# Patient Record
Sex: Male | Born: 1985 | Race: Black or African American | Hispanic: No | Marital: Single | Smoking: Current every day smoker
Health system: Southern US, Community
[De-identification: ages and names within clinical notes are randomized; demographics above are authoritative.]

## PROBLEM LIST (undated history)

## (undated) DIAGNOSIS — K209 Esophagitis, unspecified without bleeding: Secondary | ICD-10-CM

---

## 2017-02-14 ENCOUNTER — Emergency Department (HOSPITAL_BASED_OUTPATIENT_CLINIC_OR_DEPARTMENT_OTHER): Payer: Self-pay

## 2017-02-14 ENCOUNTER — Encounter (HOSPITAL_BASED_OUTPATIENT_CLINIC_OR_DEPARTMENT_OTHER): Payer: Self-pay | Admitting: Emergency Medicine

## 2017-02-14 ENCOUNTER — Emergency Department (HOSPITAL_BASED_OUTPATIENT_CLINIC_OR_DEPARTMENT_OTHER)
Admission: EM | Admit: 2017-02-14 | Discharge: 2017-02-14 | Disposition: A | Discharge status: Home / Self Care | Payer: Self-pay | Attending: Emergency Medicine | Admitting: Emergency Medicine

## 2017-02-14 DIAGNOSIS — N200 Calculus of kidney: Secondary | ICD-10-CM

## 2017-02-14 DIAGNOSIS — F17200 Nicotine dependence, unspecified, uncomplicated: Secondary | ICD-10-CM | POA: Insufficient documentation

## 2017-02-14 HISTORY — DX: Esophagitis, unspecified: K20.9

## 2017-02-14 HISTORY — DX: Esophagitis, unspecified without bleeding: K20.90

## 2017-02-14 LAB — BASIC METABOLIC PANEL
Anion gap: 9 (ref 5–15)
BUN: 13 mg/dL (ref 6–20)
CALCIUM: 9.4 mg/dL (ref 8.9–10.3)
CHLORIDE: 103 mmol/L (ref 101–111)
CO2: 25 mmol/L (ref 22–32)
CREATININE: 1.18 mg/dL (ref 0.61–1.24)
GFR calc non Af Amer: >60 mL/min (ref >60)
GLUCOSE: 126 mg/dL — AB (ref 65–99)
Potassium: 4 mmol/L (ref 3.5–5.1)
Sodium: 137 mmol/L (ref 135–145)

## 2017-02-14 LAB — CBC WITH DIFFERENTIAL/PLATELET
Basophils Absolute: 0 10*3/uL (ref 0.0–0.1)
Basophils Relative: 0 %
Eosinophils Absolute: 0 10*3/uL (ref 0.0–0.7)
Eosinophils Relative: 0 %
HEMATOCRIT: 39.7 % (ref 39.0–52.0)
HEMOGLOBIN: 13.9 g/dL (ref 13.0–17.0)
LYMPHS ABS: 0.7 10*3/uL (ref 0.7–4.0)
LYMPHS PCT: 8 %
MCH: 29.6 pg (ref 26.0–34.0)
MCHC: 35 g/dL (ref 30.0–36.0)
MCV: 84.6 fL (ref 78.0–100.0)
MONO ABS: 0.4 10*3/uL (ref 0.1–1.0)
MONOS PCT: 4 %
NEUTROS ABS: 7.7 10*3/uL (ref 1.7–7.7)
Neutrophils Relative %: 88 %
Platelets: 178 10*3/uL (ref 150–400)
RBC: 4.69 MIL/uL (ref 4.22–5.81)
RDW: 13.4 % (ref 11.5–15.5)
WBC: 8.8 10*3/uL (ref 4.0–10.5)

## 2017-02-14 MED ORDER — MORPHINE SULFATE (PF) 4 MG/ML IV SOLN
4.0000 mg | Freq: Once | INTRAVENOUS | Status: AC
  Administered 2017-02-14: 4 mg via INTRAVENOUS

## 2017-02-14 MED ORDER — OXYCODONE HCL 5 MG PO TABS: 5.0000 mg | ORAL_TABLET | Freq: Two times a day (BID) | ORAL | 0 refills | Status: DC | PRN

## 2017-02-14 MED ORDER — MORPHINE SULFATE (PF) 4 MG/ML IV SOLN
4.0000 mg | Freq: Once | INTRAVENOUS | Status: AC
  Administered 2017-02-14: 4 mg via INTRAVENOUS
  Filled 2017-02-14: qty 1

## 2017-02-14 MED ORDER — ONDANSETRON 4 MG PO TBDP: 4.0000 mg | ORAL_TABLET | Freq: Three times a day (TID) | ORAL | 0 refills | Status: DC | PRN

## 2017-02-14 MED ORDER — SODIUM CHLORIDE 0.9 % IV BOLUS (SEPSIS)
1000.0000 mL | Freq: Once | INTRAVENOUS | Status: AC
  Administered 2017-02-14: 1000 mL via INTRAVENOUS

## 2017-02-14 MED ORDER — ONDANSETRON HCL 4 MG/2ML IJ SOLN
4.0000 mg | Freq: Once | INTRAMUSCULAR | Status: AC
  Administered 2017-02-14: 4 mg via INTRAVENOUS
  Filled 2017-02-14: qty 2

## 2017-02-14 MED ORDER — MORPHINE SULFATE (PF) 4 MG/ML IV SOLN
INTRAVENOUS | Status: AC
  Administered 2017-02-14: 4 mg via INTRAVENOUS
  Filled 2017-02-14: qty 1

## 2017-02-14 MED ORDER — KETOROLAC TROMETHAMINE 30 MG/ML IJ SOLN
30.0000 mg | Freq: Once | INTRAMUSCULAR | Status: AC
  Administered 2017-02-14: 30 mg via INTRAVENOUS
  Filled 2017-02-14: qty 1

## 2017-02-14 NOTE — ED Triage Notes (Signed)
Pt reports mid abd pain with vomiting x 3 episodes starting at 2200 last night. Pt reports similar symptoms previously dx with esophagitis.

## 2017-02-14 NOTE — ED Provider Notes (Signed)
[facility]   CSN: [id number] Arrival date & time: 02/14/17  0453     History   Chief Complaint Chief Complaint  Patient presents with  . Abdominal Pain    HPI Dylan Bright is a 31 y.o. male.With no significant past medical history presenting today after having sudden onset right lower quadrant abdominal pain tonight. Patient states he went any dangerousness.. There is no radiation of the pain. He denies dysuria or hematuria. This occurred to him once in the pelvis" esophagitis. He has had nausea and vomiting. No diarrhea. No fevers. He since has not has his normal appetite. Then no further complaints.  10 Systems reviewed and are negative for acute change except as noted in the HPI.   HPI  Past Medical History:  Diagnosis Date  . Esophagitis     There are no active problems to display for this patient.   History reviewed. No pertinent surgical history.     Home Medications    Prior to Admission medications   Medication Sig Start Date End Date Taking? Authorizing Provider  ondansetron (ZOFRAN ODT) 4 MG disintegrating tablet Take 1 tablet (4 mg total) by mouth every 8 (eight) hours as needed for nausea or vomiting. 02/14/17   Tomasita Crumble, MD  oxyCODONE (ROXICODONE) 5 MG immediate release tablet Take 1 tablet (5 mg total) by mouth 2 (two) times daily as needed for severe pain. 02/14/17   Tomasita Crumble, MD    Family History No family history on file.  Social History Social History  Substance Use Topics  . Smoking status: Current Every Day Smoker  . Smokeless tobacco: Never Used  . Alcohol use Yes     Allergies   Patient has no known allergies.   Review of Systems Review of Systems   Physical Exam Updated Vital Signs BP (!) 155/92 (BP Location: Right Arm)   Pulse 67   Temp 98.2 F (36.8 C)   Resp 18   Ht 5\' 8"  (1.727 m)   Wt 155 lb (70.3 kg)   SpO2 100%   BMI 23.57 kg/m   Physical Exam  Constitutional: He is oriented  to person, place, and time. Vital signs are normal. He appears well-developed and well-nourished.  Non-toxic appearance. He does not appear ill. He appears distressed.  HENT:  Head: Normocephalic and atraumatic.  Nose: Nose normal.  Mouth/Throat: Oropharynx is clear and moist. No oropharyngeal exudate.  Eyes: Conjunctivae and EOM are normal. Pupils are equal, round, and reactive to light. No scleral icterus.  Neck: Normal range of motion. Neck supple. No tracheal deviation, no edema, no erythema and normal range of motion present. No thyroid mass and no thyromegaly present.  Cardiovascular: Normal rate, regular rhythm, S1 normal, S2 normal, normal heart sounds, intact distal pulses and normal pulses.  Exam reveals no gallop and no friction rub.   No murmur heard. Pulmonary/Chest: Effort normal and breath sounds normal. No respiratory distress. He has no wheezes. He has no rhonchi. He has no rales.  Abdominal: Soft. Normal appearance and bowel sounds are normal. He exhibits no distension, no ascites and no mass. There is no hepatosplenomegaly. There is tenderness. There is no rebound, no guarding and no CVA tenderness.  Mild right lower quadrant palpation.  Musculoskeletal: Normal range of motion. He exhibits no edema or tenderness.  Lymphadenopathy:    He has no cervical adenopathy.  Neurological: He is alert and oriented to person, place, and time. He has normal strength. No cranial nerve  deficit or sensory deficit.  Skin: Skin is warm, dry and intact. No petechiae and no rash noted. He is not diaphoretic. No erythema. No pallor.  Nursing note and vitals reviewed.    ED Treatments / Results  Labs (all labs ordered are listed, but only abnormal results are displayed) Labs Reviewed  BASIC METABOLIC PANEL - Abnormal; Notable for the following:       Result Value   Glucose, Bld 126 (*)    All other components within normal limits  CBC WITH DIFFERENTIAL/PLATELET    EKG  EKG  Interpretation None       Radiology Ct Renal Stone Study  Result Date: 02/14/2017 CLINICAL DATA:  Abdominal pain and nausea and vomiting. Right lower quadrant. EXAM: CT ABDOMEN AND PELVIS WITHOUT CONTRAST TECHNIQUE: Multidetector CT imaging of the abdomen and pelvis was performed following the standard protocol without IV contrast. COMPARISON:  None. FINDINGS: Lower chest: No pulmonary nodules or pleural effusion. No visible pericardial effusion. Hepatobiliary: Normal noncontrast appearance of the liver. No visible biliary dilatation. Normal gallbladder. Pancreas: Normal noncontrast appearance of the pancreas. No peripancreatic fluid collection. Spleen: Normal. Adrenal glands: Normal. Urinary Tract: --Right kidney/ureter: There are multiple right renal stones, measuring up to 3 mm. There is mild right hydroureteronephrosis. At the right ureterovesical junction, there is an obstructing stone that measures 4 x 3 mm. --Left kidney/ureter: There are multiple left renal calculi measuring up to 3 mm. No left hydronephrosis. The left ureter is unobstructed. --Urinary bladder: Unremarkable. Stomach/Bowel: No dilated loops of bowel. No evidence of colonic or enteric inflammation. No fluid collection within the abdomen. Normal appendix. Vascular/Lymphatic: No abdominal aortic aneurysm or atherosclerotic calcification. No abdominal or pelvic lymphadenopathy. Reproductive: Normal prostate. Musculoskeletal. No focal osseous lesion. Normal visualized extraperitoneal and extrathoracic soft tissues. IMPRESSION: 1. Right-sided obstructive uropathy with 4 x 3 mm stone at the right ureterovesical junction, resulting and mild right hydroureteronephrosis. 2. Other bilateral nonobstructing renal calculi. Electronically Signed   By: Deatra Robinson M.D.   On: 02/14/2017 06:09    Procedures Procedures (including critical care time)  Medications Ordered in ED Medications  sodium chloride 0.9 % bolus 1,000 mL (1,000 mLs  Intravenous New Bag/Given 02/14/17 0518)  morphine 4 MG/ML injection 4 mg (4 mg Intravenous Given 02/14/17 0515)  ketorolac (TORADOL) 30 MG/ML injection 30 mg (30 mg Intravenous Given 02/14/17 0515)  ondansetron (ZOFRAN) injection 4 mg (4 mg Intravenous Given 02/14/17 0515)     Initial Impression / Assessment and Plan / ED Course  I have reviewed the triage vital signs and the nursing notes.  Pertinent labs & imaging results that were available during my care of the patient were reviewed by me and considered in my medical decision making (see chart for details).      Patient presents to the emergency department for pain in the right lower quadrant. Given the sudden onset will obtain CT scan for further evaluation. He was given Toradol and morphine for pain control. Labs are pending.  Upon repeat evaluation, patient states his pain has improved but not resolved. He was given a second dose of morphine. Creatinine is normal, CT scan reveals right-sided nephrolithiasis with mild hydronephrosis. Education provided and he was given follow-up to see Dr. Marlou Porch in clinic. He appears well in no acute distress. Vitals taken ibuprofen for pain, or oxycodone for severe pain. He patient is safe for discharge. Final Clinical Impressions(s) / ED Diagnoses   Final diagnoses:  Nephrolithiasis    New Prescriptions New Prescriptions  ONDANSETRON (ZOFRAN ODT) 4 MG DISINTEGRATING TABLET    Take 1 tablet (4 mg total) by mouth every 8 (eight) hours as needed for nausea or vomiting.   OXYCODONE (ROXICODONE) 5 MG IMMEDIATE RELEASE TABLET    Take 1 tablet (5 mg total) by mouth 2 (two) times daily as needed for severe pain.     Tomasita CrumbleAdeleke Tim Wilhide, MD 02/14/17 [id number]

## 2017-02-14 NOTE — ED Notes (Signed)
ED Provider at bedside. 

## 2017-02-14 NOTE — ED Notes (Signed)
Patient transported to CT 

## 2017-02-14 NOTE — ED Notes (Signed)
Given urine strainer and specimen cup. Pt instructed to strain urine and if stone is caught take it to urologist with him. Verbalized understanding.

## 2017-08-19 ENCOUNTER — Emergency Department (HOSPITAL_BASED_OUTPATIENT_CLINIC_OR_DEPARTMENT_OTHER)
Admission: EM | Admit: 2017-08-19 | Discharge: 2017-08-19 | Disposition: A | Discharge status: Home / Self Care | Payer: Self-pay | Attending: Emergency Medicine | Admitting: Emergency Medicine

## 2017-08-19 ENCOUNTER — Encounter (HOSPITAL_BASED_OUTPATIENT_CLINIC_OR_DEPARTMENT_OTHER): Payer: Self-pay | Admitting: *Deleted

## 2017-08-19 DIAGNOSIS — Z79899 Other long term (current) drug therapy: Secondary | ICD-10-CM | POA: Insufficient documentation

## 2017-08-19 DIAGNOSIS — L03012 Cellulitis of left finger: Secondary | ICD-10-CM | POA: Insufficient documentation

## 2017-08-19 DIAGNOSIS — F17200 Nicotine dependence, unspecified, uncomplicated: Secondary | ICD-10-CM | POA: Insufficient documentation

## 2017-08-19 MED ORDER — DOXYCYCLINE HYCLATE 100 MG PO CAPS: 100.0000 mg | ORAL_CAPSULE | Freq: Two times a day (BID) | ORAL | 0 refills | Status: DC

## 2017-08-19 NOTE — ED Triage Notes (Signed)
Paronychia left middle finger. States it was drained at [facility] last week but infection came back.

## 2017-08-19 NOTE — ED Notes (Signed)
Pt c/o left middle finger pain and swelling around nail bed.  He did not get his abx filled from [facility] from the first visit.

## 2017-08-19 NOTE — ED Notes (Signed)
Pt verbalizes understanding of d/c instructions and denies any further needs at this time. 

## 2017-08-19 NOTE — ED Notes (Signed)
ED Provider at bedside. 

## 2017-08-19 NOTE — Discharge Instructions (Signed)
Soak the finger daily in warm water or warm water and Epsom salts for 20 minutes it would twice a day Levitra do it twice a day. Take antibiotic as directed. Return if the finger gets worse swelling. Return for any new or worse symptoms.

## 2017-08-19 NOTE — ED Provider Notes (Signed)
[facility]   CSN: [id number] Arrival date & time: 08/19/17  2019     History   Chief Complaint Chief Complaint  Patient presents with  . Recurrent Skin Infections    HPI Dylan Bright is a 31 y.o. male.  Patient with a infection to the distal aspect of the left middle finger that seems to be consistent with a paronychia on infection. Patient had that drained at [facility] a few days ago. Was started on antibiotics but did not get it filled. They do get pus out of the area. The whole finger was swollen at that time. Now does have swelling to the distal tip of the finger. Denies any further purulent discharge.      Past Medical History:  Diagnosis Date  . Esophagitis     There are no active problems to display for this patient.   History reviewed. No pertinent surgical history.     Home Medications    Prior to Admission medications   Medication Sig Start Date End Date Taking? Authorizing Provider  doxycycline (VIBRAMYCIN) 100 MG capsule Take 1 capsule (100 mg total) by mouth 2 (two) times daily. 08/19/17   Vanetta Mulders, MD  ondansetron (ZOFRAN ODT) 4 MG disintegrating tablet Take 1 tablet (4 mg total) by mouth every 8 (eight) hours as needed for nausea or vomiting. 02/14/17   Tomasita Crumble, MD  oxyCODONE (ROXICODONE) 5 MG immediate release tablet Take 1 tablet (5 mg total) by mouth 2 (two) times daily as needed for severe pain. 02/14/17   Tomasita Crumble, MD    Family History No family history on file.  Social History Social History  Substance Use Topics  . Smoking status: Current Every Day Smoker  . Smokeless tobacco: Never Used  . Alcohol use Yes     Allergies   Patient has no known allergies.   Review of Systems Review of Systems  Constitutional: Negative for fever.  HENT: Negative for congestion.   Eyes: Negative for redness.  Respiratory: Negative for shortness of breath.   Cardiovascular: Negative for chest  pain.  Gastrointestinal: Negative for abdominal pain.  Musculoskeletal: Positive for joint swelling.  Skin: Positive for wound.  Neurological: Negative for headaches.  Hematological: Does not bruise/bleed easily.     Physical Exam Updated Vital Signs BP (!) 151/94   Pulse 70   Temp 98.1 F (36.7 C) (Oral)   Resp 18   Ht 1.727 m ( )   Wt 68 kg (150 lb)   SpO2 100%   BMI 22.81 kg/m   Physical Exam  Constitutional: He is oriented to person, place, and time. He appears well-developed and well-nourished. No distress.  HENT:  Head: Normocephalic and atraumatic.  Mouth/Throat: Oropharynx is clear and moist.  Eyes: Pupils are equal, round, and reactive to light. Conjunctivae and EOM are normal.  Neck: Neck supple.  Cardiovascular: Normal rate and regular rhythm.   Pulmonary/Chest: Effort normal and breath sounds normal.  Abdominal: Soft. Bowel sounds are normal.  Musculoskeletal: Normal range of motion. He exhibits edema and tenderness.  Distal left middle finger with swelling no purulent discharge. No swelling proximal to the distal joint. Some fluctuance to the area. Some dark skin changes. No sausage or hotdog finger. Nontender to palpation on the more proximal part of the finger.  Neurological: He is alert and oriented to person, place, and time. No cranial nerve deficit or sensory deficit. He exhibits normal muscle tone. Coordination normal.  Nursing note and  vitals reviewed.    ED Treatments / Results  Labs (all labs ordered are listed, but only abnormal results are displayed) Labs Reviewed - No data to display  EKG  EKG Interpretation None       Radiology No results found.  Procedures Procedures (including critical care time)  Medications Ordered in ED Medications - No data to display   Initial Impression / Assessment and Plan / ED Course  I have reviewed the triage vital signs and the nursing notes.  Pertinent labs & imaging results that were  available during my care of the patient were reviewed by me and considered in my medical decision making (see chart for details).   patient as findings are consistent with a paronychia left middle finger. Patient would refer not to have it opened up again. He states is improved significantly. He would for just to go on antibiotics and soak the finger.: Nobody has a crystal ball see in the future but if that's what he preferred that we would give that a try. He will return for any new or worse symptoms.    Final Clinical Impressions(s) / ED Diagnoses   Final diagnoses:  Paronychia of left middle finger    New Prescriptions New Prescriptions   DOXYCYCLINE (VIBRAMYCIN) 100 MG CAPSULE    Take 1 capsule (100 mg total) by mouth 2 (two) times daily.     Vanetta Mulders, MD 08/19/17 2351

## 2017-08-21 ENCOUNTER — Encounter (HOSPITAL_BASED_OUTPATIENT_CLINIC_OR_DEPARTMENT_OTHER): Payer: Self-pay | Admitting: *Deleted

## 2017-08-21 ENCOUNTER — Emergency Department (HOSPITAL_BASED_OUTPATIENT_CLINIC_OR_DEPARTMENT_OTHER)
Admission: EM | Admit: 2017-08-21 | Discharge: 2017-08-21 | Disposition: A | Discharge status: Home / Self Care | Payer: Self-pay | Attending: Emergency Medicine | Admitting: Emergency Medicine

## 2017-08-21 DIAGNOSIS — L03012 Cellulitis of left finger: Secondary | ICD-10-CM | POA: Insufficient documentation

## 2017-08-21 DIAGNOSIS — F17210 Nicotine dependence, cigarettes, uncomplicated: Secondary | ICD-10-CM | POA: Insufficient documentation

## 2017-08-21 MED ORDER — BUPIVACAINE HCL 0.5 % IJ SOLN
10.0000 mL | Freq: Once | INTRAMUSCULAR | Status: AC
  Administered 2017-08-21: 1 mL
  Filled 2017-08-21: qty 1

## 2017-08-21 NOTE — Discharge Instructions (Signed)
Continue take your antibiotics as directed, you have packing in place you can either remove this yourself in 48 hours or return to the emergency department or the urgent care of your choice.  Hesitate to return to the emergency room for any new, worsening or concerning symptoms.

## 2017-08-21 NOTE — ED Provider Notes (Signed)
[facility]   CSN: [id number] Arrival date & time: 08/21/17  1191     History   Chief Complaint Chief Complaint  Patient presents with  . Wound Check    HPI   Blood pressure (!) 138/98, pulse (!) 54, temperature 98 F (36.7 C), temperature source Oral, resp. rate 18, SpO2 100 %.  Dylan Bright is a 31 y.o. male complaining of Painful swelling to left middle digit. He states that initially he had an IND performed at outside hospital 5 days ago. Initially he did not fill his prescription, he was seen several days ago and advised to fill prescription for doxycycline and apply warm compresses. He states he's been compliant with both that the swelling on the dorsum of the finger has increased. He denies any pain on the finger pad. He's been compliant with his antibiotics for 1-1/2 days. He is right-hand-dominant. He is a nail biter.   Past Medical History:  Diagnosis Date  . Esophagitis     There are no active problems to display for this patient.   History reviewed. No pertinent surgical history.     Home Medications    Prior to Admission medications   Medication Sig Start Date End Date Taking? Authorizing Provider  doxycycline (VIBRAMYCIN) 100 MG capsule Take 1 capsule (100 mg total) by mouth 2 (two) times daily. 08/19/17   Vanetta Mulders, MD  ondansetron (ZOFRAN ODT) 4 MG disintegrating tablet Take 1 tablet (4 mg total) by mouth every 8 (eight) hours as needed for nausea or vomiting. 02/14/17   Tomasita Crumble, MD  oxyCODONE (ROXICODONE) 5 MG immediate release tablet Take 1 tablet (5 mg total) by mouth 2 (two) times daily as needed for severe pain. 02/14/17   Tomasita Crumble, MD    Family History No family history on file.  Social History Social History  Substance Use Topics  . Smoking status: Current Every Day Smoker    Types: Cigarettes  . Smokeless tobacco: Never Used  . Alcohol use Yes     Comment: weekends     Allergies   Patient  has no known allergies.   Review of Systems Review of Systems  A complete review of systems was obtained and all systems are negative except as noted in the HPI and PMH.    Physical Exam Updated Vital Signs BP (!) 138/98 (BP Location: Right Arm)   Pulse (!) 54   Temp 98 F (36.7 C) (Oral)   Resp 18   SpO2 100%   Physical Exam  Constitutional: He is oriented to person, place, and time. He appears well-developed and well-nourished. No distress.  HENT:  Head: Normocephalic and atraumatic.  Mouth/Throat: Oropharynx is clear and moist.  Eyes: Pupils are equal, round, and reactive to light. Conjunctivae and EOM are normal.  Neck: Normal range of motion.  Cardiovascular: Normal rate, regular rhythm and intact distal pulses.   Pulmonary/Chest: Effort normal and breath sounds normal.  Abdominal: Soft. There is no tenderness.  Musculoskeletal: Normal range of motion.  Paronychia to left middle digit with no associated felon, no TTP along the flexor tendon, no fusiform swelling   Neurological: He is alert and oriented to person, place, and time.  Skin: He is not diaphoretic.  Psychiatric: He has a normal mood and affect.  Nursing note and vitals reviewed.       ED Treatments / Results  Labs (all labs ordered are listed, but only abnormal results are displayed) Labs Reviewed - No data  to display  EKG  EKG Interpretation None       Radiology No results found.  Procedures .Marland KitchenIncision and Drainage Date/Time: 08/21/2017 10:15 AM Performed by: Wynetta Emery Authorized by: Wynetta Emery   Consent:    Consent obtained:  Verbal Location:    Indications for incision and drainage: Paronychia.   Location: Left third digit. Pre-procedure details:    Skin preparation:  Betadine and Chloraprep Anesthesia (see MAR for exact dosages):    Anesthesia method:  Nerve block   Block needle gauge:  27 G   Block anesthetic:  Bupivacaine 0.5% w/o epi   Block technique:   Digital   Block injection procedure:  Anatomic landmarks identified   Block outcome:  Anesthesia achieved Procedure type:    Complexity:  Complex Procedure details:    Needle aspiration: no     Incision types:  Single straight   Scalpel blade:  11   Wound management:  Probed and deloculated, irrigated with saline and extensive cleaning   Drainage:  Bloody and purulent   Drainage amount:  Copious   Wound treatment:  Wound left open   Packing materials:  1/4 in iodoform gauze   Amount 1/4" iodoform:  3 cm Post-procedure details:    Patient tolerance of procedure:  Tolerated well, no immediate complications   (including critical care time)  Medications Ordered in ED Medications  bupivacaine (MARCAINE) 0.5 % (with pres) injection 10 mL (1 mL Infiltration Given 08/21/17 0940)     Initial Impression / Assessment and Plan / ED Course  I have reviewed the triage vital signs and the nursing notes.  Pertinent labs & imaging results that were available during my care of the patient were reviewed by me and considered in my medical decision making (see chart for details).     Vitals:   08/21/17 0839  BP: (!) 138/98  Pulse: (!) 54  Resp: 18  Temp: 98 F (36.7 C)  TempSrc: Oral  SpO2: 100%    Medications  bupivacaine (MARCAINE) 0.5 % (with pres) injection 10 mL (1 mL Infiltration Given 08/21/17 0940)    Dylan Bright is 31 y.o. male presenting with Recurrent recurrent paronychia to left middle finger with no associated felon. ID performed with copious amount of purulent drainage expressed. Wound is irrigated after loculations are broken, packing in place. Advised him to continue with doxycycline. 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  Evaluation does not show pathology that would require ongoing emergent intervention or inpatient treatment. Pt is hemodynamically stable and mentating appropriately. Discussed findings and plan with  patient/guardian, who agrees with care plan. All questions answered. Return precautions discussed and outpatient follow up given.      Final Clinical Impressions(s) / ED Diagnoses   Final diagnoses:  Paronychia of left middle finger    New Prescriptions New Prescriptions   No medications on file     Kaylyn Lim 08/21/17 1019    Vanetta Mulders, MD 08/22/17 [phone]

## 2017-08-21 NOTE — ED Triage Notes (Signed)
Abscess left middle finger; seen x 2 in ED for same and has had it drained. Finger is swollen. Pt is taking doxycycline

## 2018-09-01 IMAGING — CT CT RENAL STONE PROTOCOL
2 of 4 series · 17 of 46 positions shown, 19 images · non-contrast
Comparison: None.

CLINICAL DATA: Abdominal pain and nausea and vomiting. Right lower
quadrant.

EXAM:
CT ABDOMEN AND PELVIS WITHOUT CONTRAST
TECHNIQUE: Multidetector CT imaging of the abdomen and pelvis was performed
following the standard protocol without IV contrast.

[Series 3: axial st · axial · 0.67mm/px · z∈[-594,-174]mm · 14 of 92 slices shown, 16 images]
[im 4/92  soft-tissue]
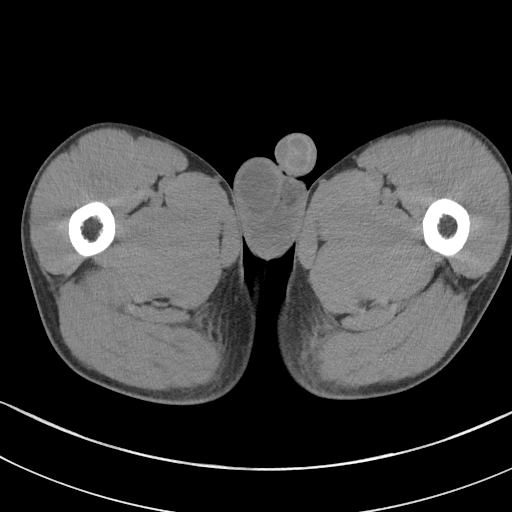
[im 4/92  bone]
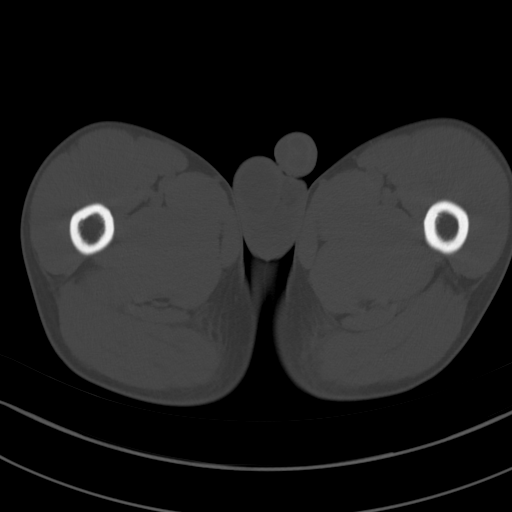
[im 12/92  soft-tissue]
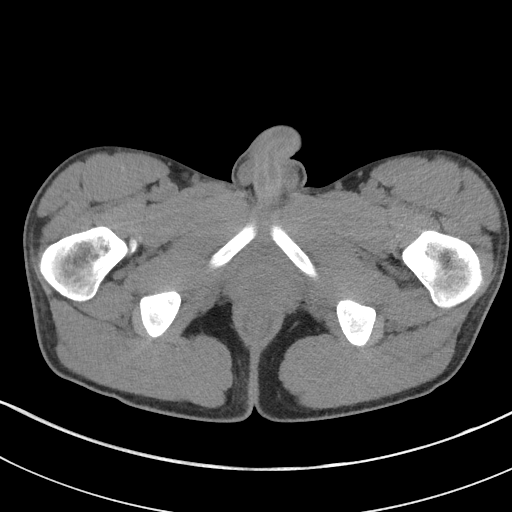
[im 19/92  soft-tissue]
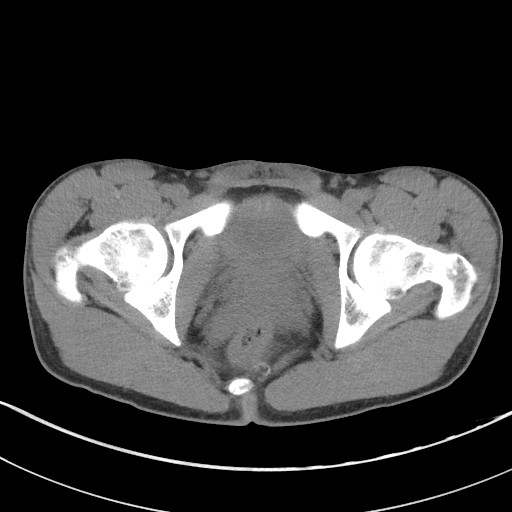
[im 23/92  soft-tissue]
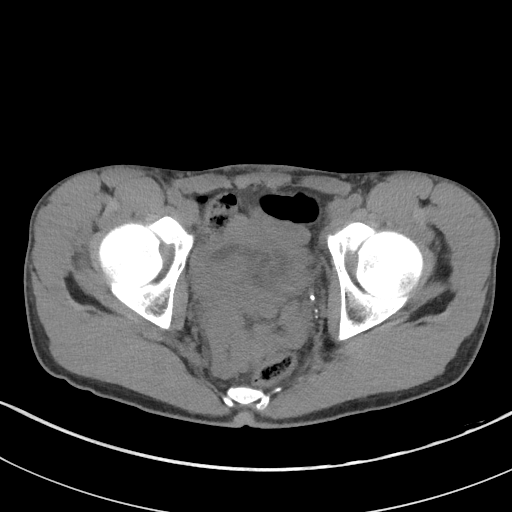
[im 31/92  soft-tissue]
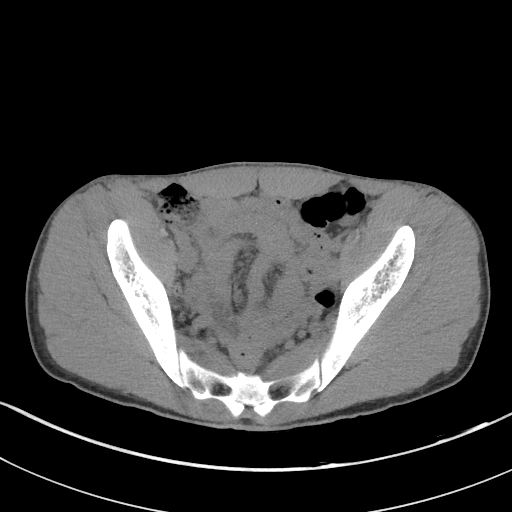
[im 38/92  soft-tissue]
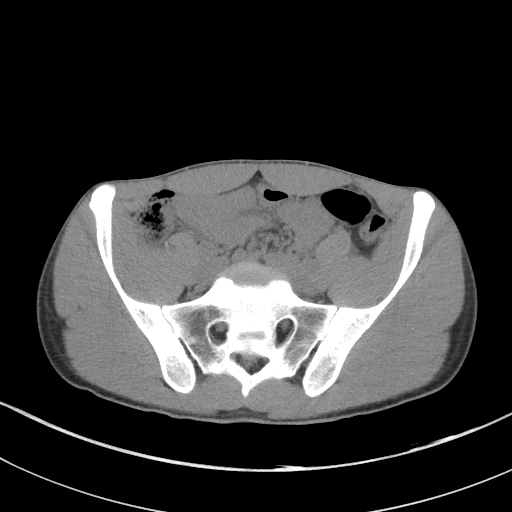
[im 42/92  soft-tissue]
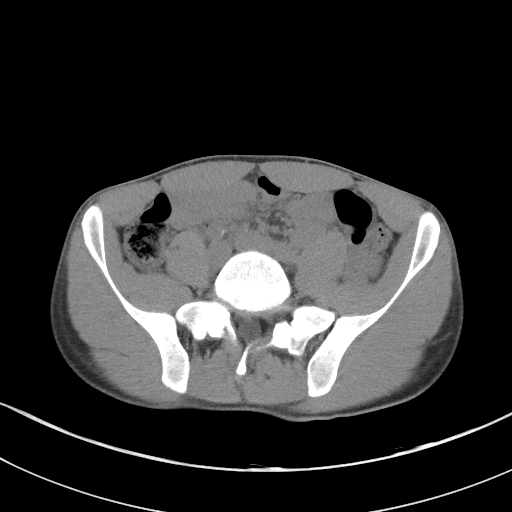
[im 50/92  soft-tissue]
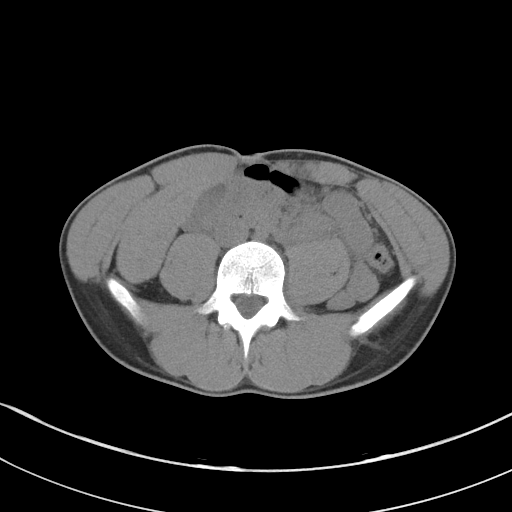
[im 54/92  soft-tissue]
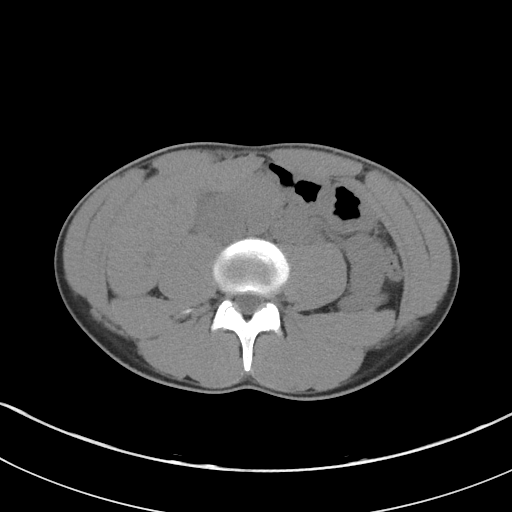
[im 54/92  bone]
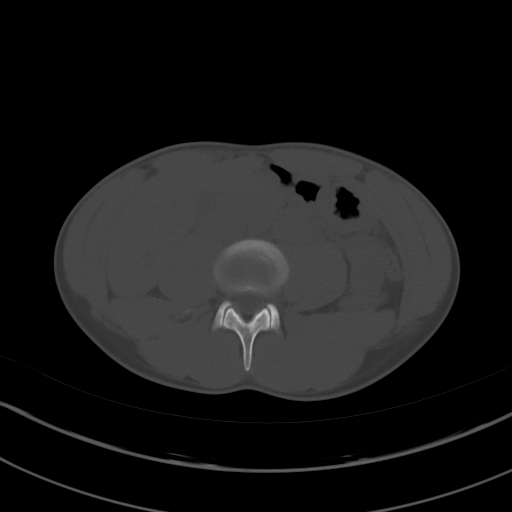
[im 61/92  soft-tissue]
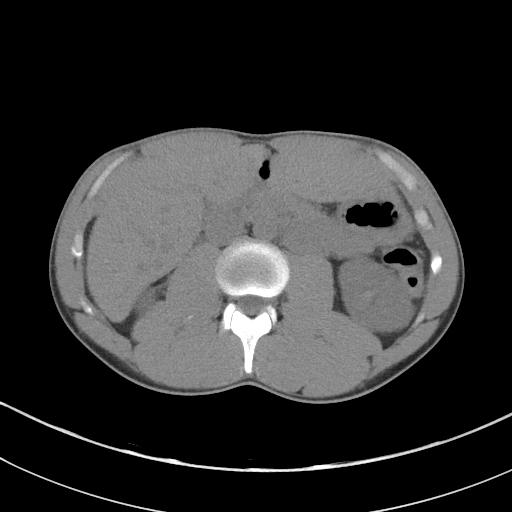
[im 69/92  soft-tissue]
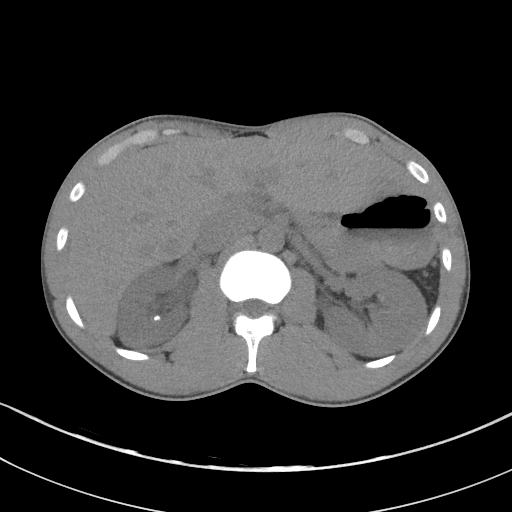
[im 73/92  soft-tissue]
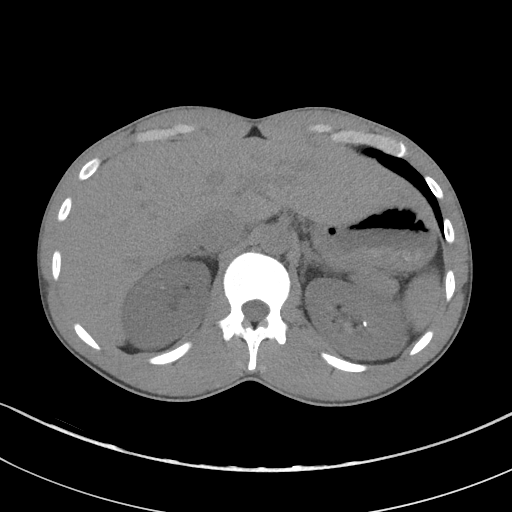
[im 80/92  soft-tissue]
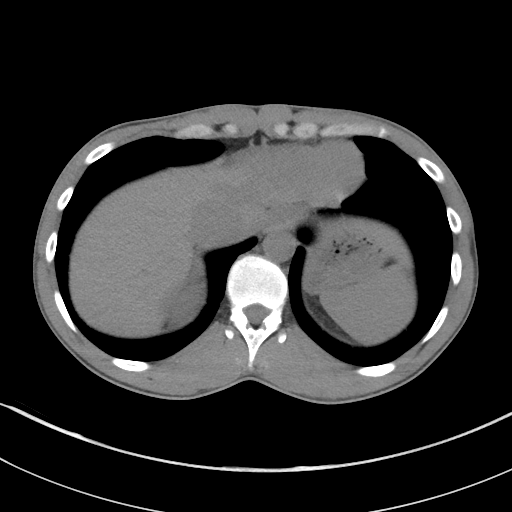
[im 88/92  soft-tissue]
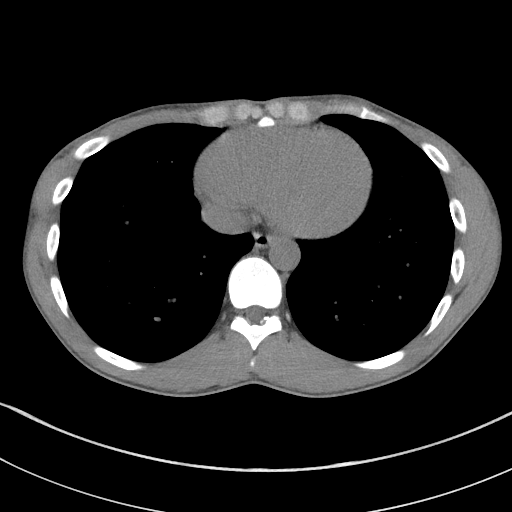

[Series 5: coronal st · coronal · 0.91mm/px · 3 of 66 slices shown]
[im 22/66  soft-tissue]
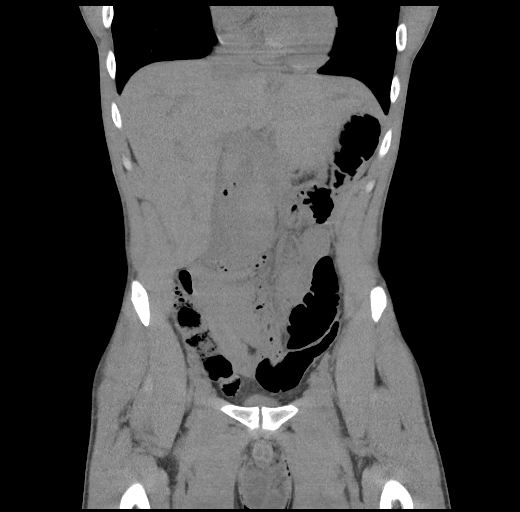
[im 29/66  soft-tissue]
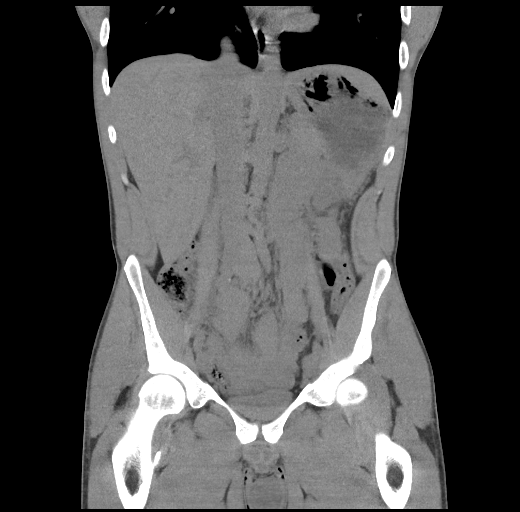
[im 37/66  soft-tissue]
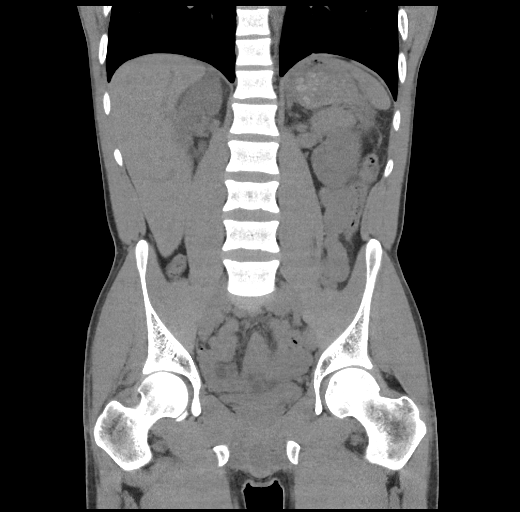

[17 of 46 positions shown; findings below may reference images not displayed]

FINDINGS: Lower chest: No pulmonary nodules or pleural effusion. No visible
pericardial effusion.

Hepatobiliary: Normal noncontrast appearance of the liver. No
visible biliary dilatation. Normal gallbladder.

Pancreas: Normal noncontrast appearance of the pancreas. No
peripancreatic fluid collection.

Spleen: Normal.

Adrenal glands: Normal.

Urinary Tract:

--Right kidney/ureter: There are multiple right renal stones,
measuring up to 3 mm. There is mild right hydroureteronephrosis. At
the right ureterovesical junction, there is an obstructing stone
that measures 4 x 3 mm.

--Left kidney/ureter: There are multiple left renal calculi
measuring up to 3 mm. No left hydronephrosis. The left ureter is
unobstructed.

--Urinary bladder: Unremarkable.

Stomach/Bowel: No dilated loops of bowel. No evidence of colonic or
enteric inflammation. No fluid collection within the abdomen. Normal
appendix.

Vascular/Lymphatic: No abdominal aortic aneurysm or atherosclerotic
calcification. No abdominal or pelvic lymphadenopathy.

Reproductive: Normal prostate.

Musculoskeletal. No focal osseous lesion. Normal visualized
extraperitoneal and extrathoracic soft tissues.
IMPRESSION: 1. Right-sided obstructive uropathy with 4 x 3 mm stone at the right
ureterovesical junction, resulting and mild right
hydroureteronephrosis.
2. Other bilateral nonobstructing renal calculi.

## 2022-05-17 ENCOUNTER — Other Ambulatory Visit: Payer: Self-pay

## 2022-05-17 ENCOUNTER — Encounter (HOSPITAL_BASED_OUTPATIENT_CLINIC_OR_DEPARTMENT_OTHER): Payer: Self-pay | Admitting: Emergency Medicine

## 2022-05-17 ENCOUNTER — Emergency Department (HOSPITAL_BASED_OUTPATIENT_CLINIC_OR_DEPARTMENT_OTHER)
Admission: EM | Admit: 2022-05-17 | Discharge: 2022-05-17 | Disposition: A | Discharge status: Home / Self Care | Payer: Self-pay | Attending: Emergency Medicine | Admitting: Emergency Medicine

## 2022-05-17 DIAGNOSIS — F17210 Nicotine dependence, cigarettes, uncomplicated: Secondary | ICD-10-CM | POA: Insufficient documentation

## 2022-05-17 DIAGNOSIS — K029 Dental caries, unspecified: Secondary | ICD-10-CM | POA: Insufficient documentation

## 2022-05-17 MED ORDER — HYDROCODONE-ACETAMINOPHEN 5-325 MG PO TABS: 1.0000 | ORAL_TABLET | ORAL | 0 refills | Status: DC | PRN

## 2022-05-17 MED ORDER — BUPIVACAINE-EPINEPHRINE (PF) 0.5% -1:200000 IJ SOLN
1.8000 mL | Freq: Once | INTRAMUSCULAR | Status: AC
  Administered 2022-05-17: 1.8 mL
  Filled 2022-05-17: qty 1.8

## 2022-05-17 MED ORDER — AMOXICILLIN 500 MG PO CAPS
500.0000 mg | ORAL_CAPSULE | Freq: Once | ORAL | Status: AC
  Administered 2022-05-17: 500 mg via ORAL
  Filled 2022-05-17: qty 1

## 2022-05-17 MED ORDER — AMOXICILLIN 500 MG PO CAPS: 500.0000 mg | ORAL_CAPSULE | Freq: Three times a day (TID) | ORAL | 0 refills | Status: DC

## 2022-05-17 NOTE — ED Provider Notes (Signed)
[facility] Provider Note: Dylan Dell, MD, FACEP  CSN: [id number] MRN: [id number] ARRIVAL: 05/17/22 at 0331 ROOM: [facility]   CHIEF COMPLAINT  Dental Pain   HISTORY OF PRESENT ILLNESS  05/17/22 4:01 AM Dylan Bright is a 36 y.o. male who has a broken bottom left second molar.  It broke about a month ago.Marland Kitchen  He is here with pain in that tooth for about the past 2 hours.  He rates the pain as an 8 out of 10.  He took extra strength Tylenol without relief.   Past Medical History:  Diagnosis Date   Esophagitis     History reviewed. No pertinent surgical history.  History reviewed. No pertinent family history.  Social History   Tobacco Use   Smoking status: Every Day    Packs/day: 1.00    Types: Cigarettes   Smokeless tobacco: Never  Vaping Use   Vaping Use: Never used  Substance Use Topics   Alcohol use: Yes    Comment: weekends   Drug use: Yes    Types: Marijuana    Comment: occ    Prior to Admission medications   Medication Sig Start Date End Date Taking? Authorizing Provider  amoxicillin (AMOXIL) 500 MG capsule Take 1 capsule (500 mg total) by mouth 3 (three) times daily. 05/17/22  Yes Dylan Furrow, MD  HYDROcodone-acetaminophen (NORCO) 5-325 MG tablet Take 1 tablet by mouth every 4 (four) hours as needed for severe pain. 05/17/22  Yes Dylan Grand, MD    Allergies Patient has no known allergies.   REVIEW OF SYSTEMS  Negative except as noted here or in the History of Present Illness.   PHYSICAL EXAMINATION  Initial Vital Signs Blood pressure (!) 134/92, pulse (!) 52, temperature 97.7 F (36.5 C), temperature source Oral, resp. rate 14, height 5\' 8"  (1.727 m), weight 61.2 kg, SpO2 99 %.  Examination General: Well-developed, well-nourished male in no acute distress; appearance consistent with age of record HENT: normocephalic; atraumatic; multiple missing molars; fractured left lower second molar with tenderness to percussion Eyes: pupils  equal, round and reactive to light; extraocular muscles intact Neck: supple Heart: regular rate and rhythm Lungs: clear to auscultation bilaterally Abdomen: soft; nondistended; nontender; bowel sounds present Extremities: No deformity; full range of motion Neurologic: Awake, alert and oriented; motor function intact in all extremities and symmetric; no facial droop Skin: Warm and dry Psychiatric: Flat affect   RESULTS  Summary of this visit's results, reviewed and interpreted by myself:   EKG Interpretation  Date/Time:    Ventricular Rate:    PR Interval:    QRS Duration:   QT Interval:    QTC Calculation:   R Axis:     Text Interpretation:         Laboratory Studies: No results found for this or any previous visit (from the past 24 hour(s)). Imaging Studies: No results found.  ED COURSE and MDM  Nursing notes, initial and subsequent vitals signs, including pulse oximetry, reviewed and interpreted by myself.  Vitals:   05/17/22 0338 05/17/22 0341  BP:  (!) 134/92  Pulse:  (!) 52  Resp:  14  Temp:  97.7 F (36.5 C)  TempSrc:  Oral  SpO2:  99%  Weight: 61.2 kg   Height: 5\' 8"  (1.727 m)    Medications  bupivacaine-epinephrine (PF) (MARCAINE W/ EPI) 0.5% -1:200000 injection 1.8 mL (1.8 mLs Infiltration Given by Other 05/17/22 0410)  amoxicillin (AMOXIL) capsule 500 mg (500 mg Oral Given 05/17/22 0409)  We will start patient on amoxicillin and refer to dentist on-call.  He will need definitive treatment of the tooth, either extraction or restoration.   PROCEDURES  Procedures DENTAL BLOCK 1.8 milliliters of 0.5% bupivacaine with epinephrine were injected into the buccal fold adjacent to the left lower second molar. The patient tolerated this well and there were no immediate complications. Adequate analgesia was obtained.   ED DIAGNOSES     ICD-10-CM   1. Pain due to dental caries  K02.9          Dylan Bright, Dylan Ruiz, MD 05/17/22 [phone]

## 2022-05-17 NOTE — ED Triage Notes (Signed)
Pt is c/o toothache on the bottom left x 2 hours  Pt states he took extra strength tylenol for the pain with out relief  Pt states the tooth is broken

## 2022-06-08 ENCOUNTER — Emergency Department (HOSPITAL_BASED_OUTPATIENT_CLINIC_OR_DEPARTMENT_OTHER)
Admission: EM | Admit: 2022-06-08 | Discharge: 2022-06-08 | Disposition: A | Discharge status: Home / Self Care | Payer: Self-pay | Attending: Emergency Medicine | Admitting: Emergency Medicine

## 2022-06-08 ENCOUNTER — Encounter (HOSPITAL_BASED_OUTPATIENT_CLINIC_OR_DEPARTMENT_OTHER): Payer: Self-pay

## 2022-06-08 ENCOUNTER — Other Ambulatory Visit: Payer: Self-pay

## 2022-06-08 ENCOUNTER — Other Ambulatory Visit (HOSPITAL_BASED_OUTPATIENT_CLINIC_OR_DEPARTMENT_OTHER): Payer: Self-pay

## 2022-06-08 DIAGNOSIS — K0889 Other specified disorders of teeth and supporting structures: Secondary | ICD-10-CM | POA: Insufficient documentation

## 2022-06-08 MED ORDER — AMOXICILLIN 500 MG PO CAPS
500.0000 mg | ORAL_CAPSULE | Freq: Three times a day (TID) | ORAL | 0 refills | Status: AC
  Filled 2022-06-08: qty 21, 7d supply, fill #0

## 2022-06-08 MED ORDER — HYDROCODONE-ACETAMINOPHEN 5-325 MG PO TABS
1.0000 | ORAL_TABLET | Freq: Four times a day (QID) | ORAL | 0 refills | Status: AC | PRN
  Filled 2022-06-08: qty 10, 3d supply, fill #0

## 2022-06-08 MED ORDER — HYDROCODONE-ACETAMINOPHEN 5-325 MG PO TABS
1.0000 | ORAL_TABLET | Freq: Once | ORAL | Status: AC
  Administered 2022-06-08: 1 via ORAL
  Filled 2022-06-08: qty 1

## 2022-06-08 NOTE — ED Triage Notes (Signed)
Patient states he broke his tooth on 6/4 and has had pain in the left lower jaw since the incidence. Patient states he has not followed up with dentist since problem occurred

## 2022-06-08 NOTE — ED Provider Notes (Signed)
[facility] EMERGENCY DEPARTMENT Provider Note   CSN: [id number] Arrival date & time: 06/08/22  1450     History  Chief Complaint  Patient presents with   Dental Pain    Dylan Bright is a 36 y.o. male.  Patient here with left lower dental pain.  He broke his tooth about a month and a half ago and still having some discomfort.  He has not been able to follow-up with a dentist.  Denies any fevers or chills.  Nothing makes it worse or better.  Denies any sore throat, neck pain, chest pain, shortness of breath, abdominal pain  The history is provided by the patient.       Home Medications Prior to Admission medications   Medication Sig Start Date End Date Taking? Authorizing Provider  amoxicillin (AMOXIL) 500 MG capsule Take 1 capsule (500 mg total) by mouth 3 (three) times daily. 06/08/22   Glennie Rodda, DO  HYDROcodone-acetaminophen (NORCO) 5-325 MG tablet Take 1 tablet by mouth every 6 (six) hours as needed for severe pain. 06/08/22   Virgina Norfolk, DO      Allergies    Patient has no known allergies.    Review of Systems   Review of Systems  Physical Exam Updated Vital Signs BP 133/87   Pulse (!) 55   Temp 98.6 F (37 C) (Oral)   Resp 16   SpO2 100%  Physical Exam Constitutional:      General: He is not in acute distress.    Appearance: He is not ill-appearing.  HENT:     Head: Normocephalic.     Nose: Nose normal.     Mouth/Throat:     Mouth: Mucous membranes are moist.     Comments: The left lower molar with chipped but there is no surrounding swelling or erythema.  No submandibular swelling, no trismus, no drooling Eyes:     Extraocular Movements: Extraocular movements intact.     Pupils: Pupils are equal, round, and reactive to light.  Neurological:     Mental Status: He is alert.     ED Results / Procedures / Treatments   Labs (all labs ordered are listed, but only abnormal results are displayed) Labs Reviewed - No data to  display  EKG None  Radiology No results found.  Procedures Procedures    Medications Ordered in ED Medications  HYDROcodone-acetaminophen (NORCO/VICODIN) 5-325 MG per tablet 1 tablet (1 tablet Oral Given 06/08/22 1525)    ED Course/ Medical Decision Making/ A&P                           Medical Decision Making Risk Prescription drug management.   Bud Face is here with left lower dental pain.  Chipped his tooth several weeks ago.  Has not been able to follow-up with a dentist.  He has a chipped left lower molar but otherwise there is no surrounding swelling.  No submandibular swelling.  Will provide pain medication and antibiotics and resources for dentistry.  Discharged in good condition.  This chart was dictated using voice recognition software.  Despite best efforts to proofread,  errors can occur which can change the documentation meaning.         Final Clinical Impression(s) / ED Diagnoses Final diagnoses:  Pain, dental    Rx / DC Orders ED Discharge Orders          Ordered    amoxicillin (AMOXIL) 500 MG capsule  3 times daily        06/08/22 1528    HYDROcodone-acetaminophen (NORCO) 5-325 MG tablet  Every 6 hours PRN        06/08/22 1528              Virgina Norfolk, DO 06/08/22 1529
